# Patient Record
Sex: Female | Born: 1995 | Race: White | Hispanic: No | Marital: Single | Smoking: Current some day smoker
Health system: Southern US, Community
[De-identification: ages and names within clinical notes are randomized; demographics above are authoritative.]

## PROBLEM LIST (undated history)

## (undated) HISTORY — PX: ANKLE SURGERY: SHX546

---

## 2016-08-26 DIAGNOSIS — F17200 Nicotine dependence, unspecified, uncomplicated: Secondary | ICD-10-CM | POA: Diagnosis not present

## 2016-08-26 DIAGNOSIS — M25572 Pain in left ankle and joints of left foot: Secondary | ICD-10-CM | POA: Diagnosis not present

## 2016-08-26 DIAGNOSIS — S82852A Displaced trimalleolar fracture of left lower leg, initial encounter for closed fracture: Secondary | ICD-10-CM | POA: Diagnosis not present

## 2016-08-27 DIAGNOSIS — Z01818 Encounter for other preprocedural examination: Secondary | ICD-10-CM | POA: Diagnosis not present

## 2016-08-27 DIAGNOSIS — S82892A Other fracture of left lower leg, initial encounter for closed fracture: Secondary | ICD-10-CM | POA: Diagnosis not present

## 2016-08-27 DIAGNOSIS — M25572 Pain in left ankle and joints of left foot: Secondary | ICD-10-CM | POA: Diagnosis not present

## 2016-08-30 DIAGNOSIS — F17200 Nicotine dependence, unspecified, uncomplicated: Secondary | ICD-10-CM | POA: Diagnosis not present

## 2016-08-30 DIAGNOSIS — S82852A Displaced trimalleolar fracture of left lower leg, initial encounter for closed fracture: Secondary | ICD-10-CM | POA: Diagnosis not present

## 2016-08-30 DIAGNOSIS — M25572 Pain in left ankle and joints of left foot: Secondary | ICD-10-CM | POA: Diagnosis not present

## 2016-08-30 DIAGNOSIS — S82892A Other fracture of left lower leg, initial encounter for closed fracture: Secondary | ICD-10-CM | POA: Diagnosis not present

## 2016-09-17 DIAGNOSIS — M25572 Pain in left ankle and joints of left foot: Secondary | ICD-10-CM | POA: Diagnosis not present

## 2016-09-17 DIAGNOSIS — Z09 Encounter for follow-up examination after completed treatment for conditions other than malignant neoplasm: Secondary | ICD-10-CM | POA: Diagnosis not present

## 2016-09-17 DIAGNOSIS — F17200 Nicotine dependence, unspecified, uncomplicated: Secondary | ICD-10-CM | POA: Diagnosis not present

## 2016-10-06 DIAGNOSIS — B009 Herpesviral infection, unspecified: Secondary | ICD-10-CM | POA: Diagnosis not present

## 2016-10-06 DIAGNOSIS — Z3201 Encounter for pregnancy test, result positive: Secondary | ICD-10-CM | POA: Diagnosis not present

## 2016-10-06 DIAGNOSIS — F17210 Nicotine dependence, cigarettes, uncomplicated: Secondary | ICD-10-CM | POA: Diagnosis not present

## 2016-10-06 DIAGNOSIS — R1030 Lower abdominal pain, unspecified: Secondary | ICD-10-CM | POA: Diagnosis not present

## 2016-10-08 DIAGNOSIS — M25572 Pain in left ankle and joints of left foot: Secondary | ICD-10-CM | POA: Diagnosis not present

## 2016-10-08 DIAGNOSIS — S82892A Other fracture of left lower leg, initial encounter for closed fracture: Secondary | ICD-10-CM | POA: Diagnosis not present

## 2016-11-04 DIAGNOSIS — M25572 Pain in left ankle and joints of left foot: Secondary | ICD-10-CM | POA: Diagnosis not present

## 2016-11-05 ENCOUNTER — Emergency Department (HOSPITAL_COMMUNITY)
Admission: EM | Admit: 2016-11-05 | Discharge: 2016-11-05 | Disposition: A | Discharge status: Home / Self Care | Payer: 59 | Attending: Emergency Medicine | Admitting: Emergency Medicine

## 2016-11-05 ENCOUNTER — Encounter (HOSPITAL_COMMUNITY): Payer: Self-pay | Admitting: Emergency Medicine

## 2016-11-05 ENCOUNTER — Emergency Department (HOSPITAL_COMMUNITY): Payer: 59

## 2016-11-05 DIAGNOSIS — Y92410 Unspecified street and highway as the place of occurrence of the external cause: Secondary | ICD-10-CM | POA: Insufficient documentation

## 2016-11-05 DIAGNOSIS — S0083XA Contusion of other part of head, initial encounter: Secondary | ICD-10-CM | POA: Diagnosis not present

## 2016-11-05 DIAGNOSIS — M542 Cervicalgia: Secondary | ICD-10-CM | POA: Diagnosis not present

## 2016-11-05 DIAGNOSIS — S0990XA Unspecified injury of head, initial encounter: Secondary | ICD-10-CM | POA: Insufficient documentation

## 2016-11-05 DIAGNOSIS — O9A211 Injury, poisoning and certain other consequences of external causes complicating pregnancy, first trimester: Secondary | ICD-10-CM | POA: Insufficient documentation

## 2016-11-05 DIAGNOSIS — O99331 Smoking (tobacco) complicating pregnancy, first trimester: Secondary | ICD-10-CM | POA: Insufficient documentation

## 2016-11-05 DIAGNOSIS — Y939 Activity, unspecified: Secondary | ICD-10-CM | POA: Diagnosis not present

## 2016-11-05 DIAGNOSIS — F17200 Nicotine dependence, unspecified, uncomplicated: Secondary | ICD-10-CM | POA: Insufficient documentation

## 2016-11-05 DIAGNOSIS — Y999 Unspecified external cause status: Secondary | ICD-10-CM | POA: Diagnosis not present

## 2016-11-05 DIAGNOSIS — O9989 Other specified diseases and conditions complicating pregnancy, childbirth and the puerperium: Secondary | ICD-10-CM | POA: Diagnosis not present

## 2016-11-05 DIAGNOSIS — S199XXA Unspecified injury of neck, initial encounter: Secondary | ICD-10-CM | POA: Diagnosis not present

## 2016-11-05 DIAGNOSIS — Z3A09 9 weeks gestation of pregnancy: Secondary | ICD-10-CM | POA: Diagnosis not present

## 2016-11-05 MED ORDER — ACETAMINOPHEN 325 MG PO TABS
650.0000 mg | ORAL_TABLET | Freq: Once | ORAL | Status: AC
  Administered 2016-11-05: 650 mg via ORAL
  Filled 2016-11-05: qty 2
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
## 2016-11-05 NOTE — Discharge Instructions (Signed)
Recommend to continue tylenol.  Try to avoid motrin/ibuprofen at this stage of your pregnancy. May continue applying ice to her face and eyes to help with swelling. Follow-up with your OB-GYN.  Monitor for any abdominal pain, bleeding, fluid loss, etc. Return here for any new or worsening symptoms.
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
## 2016-11-05 NOTE — ED Triage Notes (Signed)
c-collar applied  

## 2016-11-12 DIAGNOSIS — Z3401 Encounter for supervision of normal first pregnancy, first trimester: Secondary | ICD-10-CM | POA: Diagnosis not present

## 2016-11-19 DIAGNOSIS — O26841 Uterine size-date discrepancy, first trimester: Secondary | ICD-10-CM | POA: Diagnosis not present

## 2016-12-17 DIAGNOSIS — Z3402 Encounter for supervision of normal first pregnancy, second trimester: Secondary | ICD-10-CM | POA: Diagnosis not present

## 2017-01-03 DIAGNOSIS — H5213 Myopia, bilateral: Secondary | ICD-10-CM | POA: Diagnosis not present

## 2017-01-14 DIAGNOSIS — Z3402 Encounter for supervision of normal first pregnancy, second trimester: Secondary | ICD-10-CM | POA: Diagnosis not present

## 2017-01-14 DIAGNOSIS — Z363 Encounter for antenatal screening for malformations: Secondary | ICD-10-CM | POA: Diagnosis not present

## 2017-01-14 DIAGNOSIS — O283 Abnormal ultrasonic finding on antenatal screening of mother: Secondary | ICD-10-CM | POA: Diagnosis not present

## 2017-02-11 DIAGNOSIS — O283 Abnormal ultrasonic finding on antenatal screening of mother: Secondary | ICD-10-CM | POA: Diagnosis not present

## 2017-02-11 DIAGNOSIS — Z363 Encounter for antenatal screening for malformations: Secondary | ICD-10-CM | POA: Diagnosis not present

## 2017-03-11 DIAGNOSIS — O99810 Abnormal glucose complicating pregnancy: Secondary | ICD-10-CM | POA: Diagnosis not present

## 2017-03-11 DIAGNOSIS — Z3402 Encounter for supervision of normal first pregnancy, second trimester: Secondary | ICD-10-CM | POA: Diagnosis not present

## 2017-03-19 DIAGNOSIS — O99810 Abnormal glucose complicating pregnancy: Secondary | ICD-10-CM | POA: Diagnosis not present
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
## 2017-05-06 DIAGNOSIS — Z362 Encounter for other antenatal screening follow-up: Secondary | ICD-10-CM | POA: Diagnosis not present

## 2017-05-23 DIAGNOSIS — Z8619 Personal history of other infectious and parasitic diseases: Secondary | ICD-10-CM | POA: Diagnosis not present

## 2017-05-23 DIAGNOSIS — O26833 Pregnancy related renal disease, third trimester: Secondary | ICD-10-CM | POA: Diagnosis not present

## 2017-05-23 DIAGNOSIS — N200 Calculus of kidney: Secondary | ICD-10-CM | POA: Diagnosis not present

## 2017-05-23 DIAGNOSIS — Z87891 Personal history of nicotine dependence: Secondary | ICD-10-CM | POA: Diagnosis not present

## 2017-05-23 DIAGNOSIS — Z3A38 38 weeks gestation of pregnancy: Secondary | ICD-10-CM | POA: Diagnosis not present

## 2017-05-23 DIAGNOSIS — O9989 Other specified diseases and conditions complicating pregnancy, childbirth and the puerperium: Secondary | ICD-10-CM | POA: Diagnosis not present

## 2017-05-23 DIAGNOSIS — M545 Low back pain: Secondary | ICD-10-CM | POA: Diagnosis not present

## 2017-06-16 DIAGNOSIS — Z3403 Encounter for supervision of normal first pregnancy, third trimester: Secondary | ICD-10-CM | POA: Diagnosis not present

## 2017-06-16 DIAGNOSIS — Z87891 Personal history of nicotine dependence: Secondary | ICD-10-CM | POA: Diagnosis not present

## 2017-06-16 DIAGNOSIS — Z3A00 Weeks of gestation of pregnancy not specified: Secondary | ICD-10-CM | POA: Diagnosis not present

## 2017-06-16 DIAGNOSIS — O134 Gestational [pregnancy-induced] hypertension without significant proteinuria, complicating childbirth: Secondary | ICD-10-CM | POA: Diagnosis not present

## 2017-06-16 DIAGNOSIS — Z3A40 40 weeks gestation of pregnancy: Secondary | ICD-10-CM | POA: Diagnosis not present

## 2017-06-17 DIAGNOSIS — Z3403 Encounter for supervision of normal first pregnancy, third trimester: Secondary | ICD-10-CM | POA: Diagnosis not present

## 2017-07-17 DIAGNOSIS — Z30017 Encounter for initial prescription of implantable subdermal contraceptive: Secondary | ICD-10-CM | POA: Diagnosis not present

## 2017-07-17 DIAGNOSIS — Z3049 Encounter for surveillance of other contraceptives: Secondary | ICD-10-CM | POA: Diagnosis not present

## 2017-07-17 DIAGNOSIS — Z1389 Encounter for screening for other disorder: Secondary | ICD-10-CM | POA: Diagnosis not present

## 2017-07-17 DIAGNOSIS — Z124 Encounter for screening for malignant neoplasm of cervix: Secondary | ICD-10-CM | POA: Diagnosis not present

## 2017-08-03 DIAGNOSIS — R1110 Vomiting, unspecified: Secondary | ICD-10-CM | POA: Diagnosis not present

## 2017-08-03 DIAGNOSIS — R1013 Epigastric pain: Secondary | ICD-10-CM | POA: Diagnosis not present

## 2017-08-03 DIAGNOSIS — M546 Pain in thoracic spine: Secondary | ICD-10-CM | POA: Diagnosis not present

## 2017-08-03 DIAGNOSIS — R112 Nausea with vomiting, unspecified: Secondary | ICD-10-CM | POA: Diagnosis not present

## 2017-08-03 DIAGNOSIS — K219 Gastro-esophageal reflux disease without esophagitis: Secondary | ICD-10-CM | POA: Diagnosis not present

## 2017-11-28 DIAGNOSIS — R509 Fever, unspecified: Secondary | ICD-10-CM | POA: Diagnosis not present

## 2017-11-28 DIAGNOSIS — J101 Influenza due to other identified influenza virus with other respiratory manifestations: Secondary | ICD-10-CM | POA: Diagnosis not present

## 2018-01-02 DIAGNOSIS — Z6834 Body mass index (BMI) 34.0-34.9, adult: Secondary | ICD-10-CM | POA: Diagnosis not present

## 2018-01-02 DIAGNOSIS — J029 Acute pharyngitis, unspecified: Secondary | ICD-10-CM | POA: Diagnosis not present

## 2018-04-20 DIAGNOSIS — Z3046 Encounter for surveillance of implantable subdermal contraceptive: Secondary | ICD-10-CM | POA: Diagnosis not present

## 2018-04-20 DIAGNOSIS — Z30015 Encounter for initial prescription of vaginal ring hormonal contraceptive: Secondary | ICD-10-CM | POA: Diagnosis not present

## 2018-07-30 DIAGNOSIS — Z3009 Encounter for other general counseling and advice on contraception: Secondary | ICD-10-CM | POA: Diagnosis not present

## 2018-07-30 DIAGNOSIS — Z3049 Encounter for surveillance of other contraceptives: Secondary | ICD-10-CM | POA: Diagnosis not present

## 2018-12-29 DIAGNOSIS — H5213 Myopia, bilateral: Secondary | ICD-10-CM | POA: Diagnosis not present

## 2019-01-19 DIAGNOSIS — J029 Acute pharyngitis, unspecified: Secondary | ICD-10-CM | POA: Diagnosis not present

## 2019-01-19 DIAGNOSIS — R509 Fever, unspecified: Secondary | ICD-10-CM | POA: Diagnosis not present

## 2019-01-19 DIAGNOSIS — R05 Cough: Secondary | ICD-10-CM | POA: Diagnosis not present
